# Patient Record
Sex: Male | Born: 1980 | Race: White | Hispanic: No | Marital: Married | Smoking: Current every day smoker
Health system: Southern US, Community
[De-identification: ages and names within clinical notes are randomized; demographics above are authoritative.]

## PROBLEM LIST (undated history)

## (undated) HISTORY — PX: NO PAST SURGERIES: SHX2092

---

## 2004-09-12 ENCOUNTER — Emergency Department: Payer: Self-pay | Admitting: Emergency Medicine

## 2014-05-22 ENCOUNTER — Emergency Department: Payer: Self-pay | Admitting: Emergency Medicine

## 2014-05-22 LAB — CBC WITH DIFFERENTIAL/PLATELET
Basophil #: 0 x10 3/mm 3 (ref 0.0–0.1)
Basophil %: 0.2 %
Eosinophil #: 0 x10 3/mm 3 (ref 0.0–0.7)
Eosinophil %: 0.1 %
HCT: 49.7 % (ref 40.0–52.0)
HGB: 16.8 g/dL (ref 13.0–18.0)
Lymphocyte %: 12.1 %
Lymphs Abs: 1.8 x10 3/mm 3 (ref 1.0–3.6)
MCH: 33.6 pg (ref 26.0–34.0)
MCHC: 33.8 g/dL (ref 32.0–36.0)
MCV: 99 fL (ref 80–100)
Monocyte #: 1.1 x10 3/mm — ABNORMAL HIGH (ref 0.2–1.0)
Monocyte %: 7.2 %
Neutrophil #: 12.1 x10 3/mm 3 — ABNORMAL HIGH (ref 1.4–6.5)
Neutrophil %: 80.4 %
Platelet: 296 x10 3/mm 3 (ref 150–440)
RBC: 5.01 x10 6/mm 3 (ref 4.40–5.90)
RDW: 12.2 % (ref 11.5–14.5)
WBC: 15 x10 3/mm 3 — ABNORMAL HIGH (ref 3.8–10.6)

## 2014-05-22 LAB — COMPREHENSIVE METABOLIC PANEL WITH GFR
Albumin: 4.2 g/dL (ref 3.4–5.0)
Alkaline Phosphatase: 67 U/L
Anion Gap: 13 (ref 7–16)
BUN: 22 mg/dL — ABNORMAL HIGH (ref 7–18)
Bilirubin,Total: 1.3 mg/dL — ABNORMAL HIGH (ref 0.2–1.0)
Calcium, Total: 9.2 mg/dL (ref 8.5–10.1)
Chloride: 99 mmol/L (ref 98–107)
Co2: 26 mmol/L (ref 21–32)
Creatinine: 1.59 mg/dL — ABNORMAL HIGH (ref 0.60–1.30)
EGFR (African American): >60
EGFR (Non-African Amer.): 54 — ABNORMAL LOW
Glucose: 129 mg/dL — ABNORMAL HIGH (ref 65–99)
Osmolality: 281 (ref 275–301)
Potassium: 3.1 mmol/L — ABNORMAL LOW (ref 3.5–5.1)
SGOT(AST): 83 U/L — ABNORMAL HIGH (ref 15–37)
SGPT (ALT): 85 U/L — ABNORMAL HIGH
Sodium: 138 mmol/L (ref 136–145)
Total Protein: 7.6 g/dL (ref 6.4–8.2)

## 2014-05-22 LAB — LIPASE, BLOOD: LIPASE: 118 U/L (ref 73–393)

## 2014-05-23 LAB — COMPREHENSIVE METABOLIC PANEL
ALK PHOS: 66 U/L
ANION GAP: 14 (ref 7–16)
Albumin: 4.2 g/dL (ref 3.4–5.0)
BUN: 15 mg/dL (ref 7–18)
Bilirubin,Total: 1.6 mg/dL — ABNORMAL HIGH (ref 0.2–1.0)
CHLORIDE: 104 mmol/L (ref 98–107)
Calcium, Total: 9 mg/dL (ref 8.5–10.1)
Co2: 20 mmol/L — ABNORMAL LOW (ref 21–32)
Creatinine: 1.4 mg/dL — ABNORMAL HIGH (ref 0.60–1.30)
GLUCOSE: 104 mg/dL — AB (ref 65–99)
Osmolality: 277 (ref 275–301)
Potassium: 3.5 mmol/L (ref 3.5–5.1)
SGOT(AST): 52 U/L — ABNORMAL HIGH (ref 15–37)
SGPT (ALT): 71 U/L — ABNORMAL HIGH
SODIUM: 138 mmol/L (ref 136–145)
Total Protein: 7.6 g/dL (ref 6.4–8.2)

## 2014-05-23 LAB — CBC
HCT: 48 % (ref 40.0–52.0)
HGB: 16.2 g/dL (ref 13.0–18.0)
MCH: 33.3 pg (ref 26.0–34.0)
MCHC: 33.8 g/dL (ref 32.0–36.0)
MCV: 99 fL (ref 80–100)
Platelet: 270 10*3/uL (ref 150–440)
RBC: 4.87 10*6/uL (ref 4.40–5.90)
RDW: 12.3 % (ref 11.5–14.5)
WBC: 10.7 10*3/uL — ABNORMAL HIGH (ref 3.8–10.6)

## 2014-05-23 LAB — URINALYSIS, COMPLETE
BILIRUBIN, UR: NEGATIVE
Blood: NEGATIVE
Glucose,UR: NEGATIVE mg/dL (ref 0–75)
Leukocyte Esterase: NEGATIVE
NITRITE: NEGATIVE
PH: 7 (ref 4.5–8.0)
PROTEIN: NEGATIVE
RBC,UR: 1 /HPF (ref 0–5)
SPECIFIC GRAVITY: 1.02 (ref 1.003–1.030)
Squamous Epithelial: NONE SEEN

## 2014-05-23 LAB — LIPASE, BLOOD: Lipase: 111 U/L (ref 73–393)

## 2014-05-23 LAB — MAGNESIUM: Magnesium: 1.9 mg/dL

## 2014-05-24 ENCOUNTER — Observation Stay: Payer: Self-pay | Admitting: Internal Medicine

## 2014-05-24 LAB — CBC WITH DIFFERENTIAL/PLATELET
Basophil #: 0 10*3/uL (ref 0.0–0.1)
Basophil %: 0.2 %
Eosinophil #: 0 10*3/uL (ref 0.0–0.7)
Eosinophil %: 0.1 %
HCT: 42.5 % (ref 40.0–52.0)
HGB: 14.1 g/dL (ref 13.0–18.0)
LYMPHS PCT: 26.1 %
Lymphocyte #: 2.9 10*3/uL (ref 1.0–3.6)
MCH: 33.3 pg (ref 26.0–34.0)
MCHC: 33.3 g/dL (ref 32.0–36.0)
MCV: 100 fL (ref 80–100)
MONO ABS: 1 x10 3/mm (ref 0.2–1.0)
Monocyte %: 9.4 %
NEUTROS PCT: 64.2 %
Neutrophil #: 7.1 10*3/uL — ABNORMAL HIGH (ref 1.4–6.5)
Platelet: 225 10*3/uL (ref 150–440)
RBC: 4.24 10*6/uL — AB (ref 4.40–5.90)
RDW: 12.2 % (ref 11.5–14.5)
WBC: 11.1 10*3/uL — ABNORMAL HIGH (ref 3.8–10.6)

## 2014-05-24 LAB — COMPREHENSIVE METABOLIC PANEL
ALK PHOS: 54 U/L
AST: 35 U/L (ref 15–37)
Albumin: 3.3 g/dL — ABNORMAL LOW (ref 3.4–5.0)
Anion Gap: 5 — ABNORMAL LOW (ref 7–16)
BILIRUBIN TOTAL: 1.1 mg/dL — AB (ref 0.2–1.0)
BUN: 11 mg/dL (ref 7–18)
CREATININE: 1.29 mg/dL (ref 0.60–1.30)
Calcium, Total: 8.2 mg/dL — ABNORMAL LOW (ref 8.5–10.1)
Chloride: 109 mmol/L — ABNORMAL HIGH (ref 98–107)
Co2: 28 mmol/L (ref 21–32)
EGFR (African American): >60
Glucose: 101 mg/dL — ABNORMAL HIGH (ref 65–99)
OSMOLALITY: 283 (ref 275–301)
Potassium: 4.1 mmol/L (ref 3.5–5.1)
SGPT (ALT): 53 U/L
Sodium: 142 mmol/L (ref 136–145)
Total Protein: 6.3 g/dL — ABNORMAL LOW (ref 6.4–8.2)

## 2014-07-18 ENCOUNTER — Emergency Department: Payer: Self-pay | Admitting: Internal Medicine

## 2014-07-18 LAB — CBC WITH DIFFERENTIAL/PLATELET
BASOS ABS: 0 10*3/uL (ref 0.0–0.1)
BASOS PCT: 0.4 %
BASOS PCT: 0.4 %
Basophil #: 0.1 10*3/uL (ref 0.0–0.1)
EOS ABS: 0.1 10*3/uL (ref 0.0–0.7)
Eosinophil #: 0.1 10*3/uL (ref 0.0–0.7)
Eosinophil %: 0.6 %
Eosinophil %: 0.7 %
HCT: 50.1 % (ref 40.0–52.0)
HCT: 43.1 % (ref 40.0–52.0)
HGB: 16.9 g/dL (ref 13.0–18.0)
HGB: 14.5 g/dL (ref 13.0–18.0)
LYMPHS PCT: 14 %
Lymphocyte #: 1.7 10*3/uL (ref 1.0–3.6)
Lymphocyte #: 1.8 10*3/uL (ref 1.0–3.6)
Lymphocyte %: 10.4 %
MCH: 33.2 pg (ref 26.0–34.0)
MCH: 33.1 pg (ref 26.0–34.0)
MCHC: 33.8 g/dL (ref 32.0–36.0)
MCHC: 33.6 g/dL (ref 32.0–36.0)
MCV: 98 fL (ref 80–100)
MCV: 99 fL (ref 80–100)
MONO ABS: 0.7 x10 3/mm (ref 0.2–1.0)
MONOS PCT: 4.6 %
Monocyte #: 1.2 x10 3/mm — ABNORMAL HIGH (ref 0.2–1.0)
Monocyte %: 9 %
NEUTROS PCT: 84 %
NEUTROS PCT: 75.9 %
Neutrophil #: 13.5 10*3/uL — ABNORMAL HIGH (ref 1.4–6.5)
Neutrophil #: 9.8 10*3/uL — ABNORMAL HIGH (ref 1.4–6.5)
PLATELETS: 274 10*3/uL (ref 150–440)
Platelet: 367 10*3/uL (ref 150–440)
RBC: 5.1 10*6/uL (ref 4.40–5.90)
RBC: 4.37 10*6/uL — ABNORMAL LOW (ref 4.40–5.90)
RDW: 12.7 % (ref 11.5–14.5)
RDW: 12.9 % (ref 11.5–14.5)
WBC: 16 10*3/uL — ABNORMAL HIGH (ref 3.8–10.6)
WBC: 12.9 10*3/uL — ABNORMAL HIGH (ref 3.8–10.6)

## 2014-07-18 LAB — URINALYSIS, COMPLETE
BACTERIA: NONE SEEN
BILIRUBIN, UR: NEGATIVE
Blood: NEGATIVE
GLUCOSE, UR: NEGATIVE mg/dL (ref 0–75)
LEUKOCYTE ESTERASE: NEGATIVE
Nitrite: NEGATIVE
Ph: 9 (ref 4.5–8.0)
Protein: 30
RBC, UR: NONE SEEN /HPF (ref 0–5)
SQUAMOUS EPITHELIAL: NONE SEEN
Specific Gravity: 1.019 (ref 1.003–1.030)
WBC UR: <1 /HPF (ref 0–5)

## 2014-07-18 LAB — COMPREHENSIVE METABOLIC PANEL
Albumin: 4.6 g/dL (ref 3.4–5.0)
Alkaline Phosphatase: 91 U/L (ref 46–116)
Anion Gap: 10 (ref 7–16)
BILIRUBIN TOTAL: 0.8 mg/dL (ref 0.2–1.0)
BUN: 18 mg/dL (ref 7–18)
CALCIUM: 10.2 mg/dL — AB (ref 8.5–10.1)
CHLORIDE: 105 mmol/L (ref 98–107)
Co2: 25 mmol/L (ref 21–32)
Creatinine: 1.53 mg/dL — ABNORMAL HIGH (ref 0.60–1.30)
EGFR (African American): >60
EGFR (Non-African Amer.): 57 — ABNORMAL LOW
Glucose: 149 mg/dL — ABNORMAL HIGH (ref 65–99)
OSMOLALITY: 284 (ref 275–301)
Potassium: 4.4 mmol/L (ref 3.5–5.1)
SGOT(AST): 30 U/L (ref 15–37)
SGPT (ALT): 24 U/L (ref 14–63)
Sodium: 140 mmol/L (ref 136–145)
Total Protein: 8.6 g/dL — ABNORMAL HIGH (ref 6.4–8.2)

## 2014-07-18 LAB — BASIC METABOLIC PANEL
Anion Gap: 7 (ref 7–16)
BUN: 16 mg/dL (ref 7–18)
CALCIUM: 8.9 mg/dL (ref 8.5–10.1)
CHLORIDE: 111 mmol/L — AB (ref 98–107)
CO2: 24 mmol/L (ref 21–32)
Creatinine: 1.21 mg/dL (ref 0.60–1.30)
EGFR (African American): >60
EGFR (Non-African Amer.): >60
Glucose: 98 mg/dL (ref 65–99)
Osmolality: 284 (ref 275–301)
POTASSIUM: 3.9 mmol/L (ref 3.5–5.1)
SODIUM: 142 mmol/L (ref 136–145)

## 2014-07-18 LAB — LIPASE, BLOOD: Lipase: 167 U/L (ref 73–393)

## 2014-07-19 DIAGNOSIS — N179 Acute kidney failure, unspecified: Secondary | ICD-10-CM | POA: Insufficient documentation

## 2014-07-19 DIAGNOSIS — R112 Nausea with vomiting, unspecified: Secondary | ICD-10-CM | POA: Insufficient documentation

## 2014-07-19 DIAGNOSIS — E860 Dehydration: Secondary | ICD-10-CM | POA: Insufficient documentation

## 2014-10-04 NOTE — Consult Note (Signed)
PATIENT NAME:  Stephen Ochoa, Stephen Ochoa MR#:  [id number] DATE OF BIRTH:  1980-09-02  DATE OF CONSULTATION:  05/24/2014    CONSULTING PHYSICIAN:  Midge Minium, MD CONSULTING SERVICE: Gastroenterology    REASON FOR CONSULTATION: Nausea and vomiting.   HISTORY OF PRESENT ILLNESS: The patient is a 34 year old gentleman who states that he went out with his father-in-law for a baseball game, had a bunch of beers, and then started having intractable nausea and vomiting.  The patient states this has been going on since last Sunday. The patient now reports that he was admitted because of nonresolution of his symptoms after being seen at the local urgent care.  The patient was given a diet, which included a BRAT diet that he reports made his symptoms worse.  He was also told to take ginger ale for his heartburn and his abdominal pain which he reports made his vomiting worse.  The patient also followed up with urgent care again and was given much of the same except the second time he was given a shot that helped with his nausea.  He reports that his best day was when he was just taking clear liquids and Pedialyte throughout the day without any problems. The patient then started to advance his diet and he started to have his problem again.  The patient underwent a CT scan of the abdomen and pelvis that was unremarkable for any acute processes. The patient had elevated liver function tests, which was significant for an elevated bilirubin. The patient has been treated with pain medication, Phenergan and he reports taking Benadryl.  The patient states that he thinks some of these medicines are making him worse.  The patient denies any sick contacts or ever having this in the past.  He also denies any marijuana use.     PAST MEDICAL HISTORY: Negative.   PAST SURGICAL HISTORY: None.   HOME MEDICATIONS: None.   ALLERGIES: No known drug allergies.   FAMILY HISTORY: Noncontributory.   SOCIAL HISTORY: History of smoking in the  past but quit a couple of years ago, drinks 1 to 2 beers every weekend.  Denies any drug abuse.   REVIEW OF SYSTEMS:  A 10 point review of systems negative except what was stated above.   PHYSICAL EXAMINATION:  GENERAL: The patient is sitting in bed in no apparent distress is slightly lethargic after having medication a short time ago.  VITAL SIGNS: Temperature 98.3, pulse 67, respirations 17, blood pressure 105/62, pulse oximetry 99%.  HEENT: Normocephalic, atraumatic. Extraocular motor intact. Pupils equally round and reactive to light and accommodation without JVD, without lymphadenopathy. LUNGS: Clear to auscultation. HEART: Regular rate and rhythm without murmurs, rubs, or gallops.  ABDOMEN: Soft, nontender, nondistended, without hepatosplenomegaly.  EXTREMITIES: Without cyanosis, clubbing, or edema.  NEUROLOGICAL: Grossly intact.  SKIN: Without any rashes or lesions.   ANCILLARY SERVICES: CT scan as stated above. Ultrasound showed the patient to have no evidence of cholelithiasis or cholecystitis with multiple lesions consistent with cavernous hemangiomas.   The labs showed the patient to have normal liver enzymes with a total bilirubin of 1.1, white cell count of 11.1.   ASSESSMENT AND PLAN: This patient is a 34 year old gentleman who has intractable nausea and vomiting with an ultrasound and CT scan not showing any source for his symptoms. The patient denies having diabetes. It is unlikely that the patient has gastroparesis because he vomits shortly after eating and it is usually not food he had eaten hours before. The patient  also states that he has significant heartburn which burns more when he vomits. The patient will be put on Protonix p.o. twice a day. The patient was offered IV but states that he feels that he can keep down and swallow pills.  He also has been told that it may take some time for this to help. Despite the numerous other possible causes of nausea and vomiting, such as  central causes, the patient has been told that if this is from a GI cause, it is usually because of nonfunctioning gallbladder, gastritis versus reflux.  If gastritis or reflux was seen on upper endoscopy, he would be treated with a proton pump inhibitor.  Because of this, we will treat him empirically with a proton pump inhibitor and if he does not improve, then an upper endoscopy may be needed.  He also may need a gallbladder emptying study if the symptoms do not improve.   Thank you very much for involving me in the care of this patient. If you have any questions, please do not hesitate to call.  I will follow the patient along with you.     ____________________________ Midge Miniumarren Karder Goodin, MD dw:DT D: 05/24/2014 12:38:09 ET T: 05/24/2014 13:15:23 ET JOB#: [id number]  cc: Midge Miniumarren Grayton Lobo, MD, <Dictator> Midge MiniumARREN Allea Kassner MD ELECTRONICALLY SIGNED 06/02/2014 14:17

## 2014-10-04 NOTE — H&P (Signed)
PATIENT NAME:  Stephen Ochoa, Stephen Ochoa MR#:  [id number] DATE OF BIRTH:  03-07-1981  DATE OF ADMISSION:  05/23/2014  REFERRING DOCTOR: Bobetta Lime A. Inocencio Homes, MD  PRIMARY CARE DOCTOR: None.  ADMITTING DOCTOR: Crissie Figures, MD   CHIEF COMPLAINT: Recurrent/persistent nausea, vomiting with associated crampy abdominal pain ongoing for the past 4 days.    HISTORY OF PRESENT ILLNESS: A 34 year old Caucasian male with no significant past medical history presents to the Emergency Room with the complaints of persistent/recurrent intractable nausea, vomiting associated with crampy abdominal pain ongoing for the past 4 days. The patient states he was doing absolutely well up until last Sunday when he started developing nausea, vomiting with associated crampy abdominal pain for which he went to a local urgent care center was evaluated and treated with IV fluids and was sent home on antinausea medications and pain medications following which his symptoms where under control but continued to have persistent/recurrent nausea, vomiting and abdominal pain; hence went to the urgent care, again, the following day and was treated again with IV fluids and sent home on oral and suppository antiemetic medications and pain medications. The patient continued to have these symptoms intermittently with short periods of able to tolerate oral clear liquids for which he came to the Emergency Room at [facility] on December 10 and was evaluated by the ED physician at that time and the lab work was unremarkable except for a mild elevation of liver function tests. The patient underwent a CT of the abdomen and the pelvis at that time, which was unremarkable for any acute abdominal process. The patient was treated with IV fluids and IV pain medications and IV antiemetics following which his symptoms were under control and was sent home on Zofran and Phenergan suppositories and the pain medications. He continued to have the symptoms with persistent/recurrent  nausea, vomiting with crampy abdomen pain, hence came to the Emergency Room again today for further evaluation. The patient was evaluated by the ER physician again today and lab work again was unremarkable except for mildly elevated liver function tests which kind of improving compared to previous days' visit and underwent a right upper quadrant abdominal sonogram, which is negative for any acute intra-abdominal process. The patient was given IV Dilaudid , IV hydromorphone, IV Zofran, and IV Phenergan following which his nausea and vomiting is under control at this time significantly, but he is not feeling well to go home. Hence, hospitalist service was consulted for further evaluation and management. In view of his persistent ongoing GI symptoms, the patient is reluctant to go home. Denies any fever. No chest pain. No shortness of breath. No cough. No urinary symptoms. No focal weakness or numbness. No history of any prior symptoms in the past. No history of any gastrointestinal problems in the past.   PAST MEDICAL HISTORY: No significant past medical history.   PAST SURGICAL HISTORY: Nil significant.   HOME MEDICATIONS: None.   ALLERGIES: No known drug allergies.   FAMILY HISTORY: Nil significant.   SOCIAL HISTORY: He is married. He is self-employed, works as an Art gallery manager. He lives with his family. History of smoking in the past but quit a few years ago. He does take 1-2 beers every weekend. Denies any substance abuse.   REVIEW OF SYSTEMS:  CONSTITUTIONAL: Negative for fever, fatigue, or generalized weakness. He does have some abdominal pain as mentioned in the history of present illness.  EYES: Negative for blurred vision, double vision. No pain. No redness or inflammation.  EARS,  NOSE, AND THROAT: Negative for tinnitus, ear pain, hearing loss, epistaxis, nasal discharge, difficulty swallowing.  RESPIRATORY: Negative for cough, wheezing, hemoptysis, painful respirations, dyspnea.   CARDIOVASCULAR: Negative for chest pain, orthopnea, pedal edema, dyspnea on exertion. No palpitations. No syncope. No dizziness.  GASTROINTESTINAL: Positive for persistent intractable nausea, vomiting with crappy abdominal pain ongoing for the past 4 days, as mentioned in the history of present illness. No blood in the vomitus. No diarrhea. No melena or hematochezia.  GENITOURINARY: Negative for dysuria, hematuria, frequency, urgency.  ENDOCRINE: Negative for polyuria, nocturia, no heat or cold intolerance.  HEMATOLOGIC AND LYMPHATIC: Negative for anemia, easy bruising or bleeding, or swollen glands.  INTEGUMENTARY: Negative for acne, skin rash, lesions.  MUSCULOSKELETAL: Negative for any arthritis, joint swelling, or pain.  NEUROLOGICAL: Negative for focal weakness or numbness. No history of CVA, TIA, or seizure disorder.  PSYCHIATRIC: Negative for anxiety, insomnia, depression.   PHYSICAL EXAMINATION:  VITAL SIGNS: Temperature 98.2 degrees Fahrenheit, pulse rate 83 per minute, respirations 20 per minute, blood pressure 144/97, oxygen saturation 100% on room air.  GENERAL: A well-developed, well-nourished young male alert and oriented. Not in any acute distress at this time.   HEAD: Atraumatic, normocephalic.  EYES: Pupils are equal and reactive to light and accommodation. No conjunctival pallor. No scleral icterus. Extraocular movements intact.  NOSE: No nasal lesions. No drainage.  EARS: No drainage. No external lesions.  ORAL CAVITY: No mucosal lesions. No exudates. No masses.  NECK: Supple. No JVD. No thyromegaly. No carotid bruit. Range of motion of the neck is normal.  RESPIRATORY: Good respiratory effort. Clear to auscultation bilaterally. No rales or rhonchi.  CARDIOVASCULAR: S1, S2 regular. No murmurs appreciated. Peripheral pulses equal at carotid, femoral, and pedal pulses. No peripheral edema.  GASTROINTESTINAL: Abdomen is soft. Diffuse epigastric mild tenderness. No guarding. No  rigidity. No rebound. No hepatosplenomegaly. Bowel sounds present and equal in all 4 quadrants.  GENITOURINARY: Deferred.  MUSCULOSKELETAL: Range of motion adequate in all joints. Strength and tone are equal bilaterally.  SKIN: Inspection within normal limits.  LYMPHATIC: No cervical lymphadenopathy.  VASCULAR: Good dorsalis pedis and posterior tibial pulses.  NEUROLOGICAL: Alert, awake, and oriented x 3. Cranial nerves II-XII grossly intact.  DTRs 2+ and symmetrical in both upper and lower extremities. Motor strength is 5/5 in both upper and lower extremities.  PSYCHIATRIC: Judgment and insight are adequate. Alert and oriented x 3. Memory and mood are intact within normal limits.   LABORATORY DATA: Serum glucose 104, BUN 15, creatinine 1.40, sodium 138, potassium 3.5, chloride 104, bicarbonate 20. Total calcium 9.0, magnesium 1.9, lipase 111, total protein 7.6, serum albumin 4.2, total bilirubin 1.6, alkaline phosphatase 66, AST 52, ALT 71 CBC: WBC 10.7, hemoglobin 16.2, hematocrit 48.0, platelet count 270,000. Urinalysis clear, 2+ ketones, negative nitrite, negative leukocyte esterase, WBC is 3+ per high-power field, bacteria, trace.   IMAGING STUDIES:  1.  CT of the abdomen and pelvis with contrast done on 05/22/2014: No focal acute inflammation process demonstrated. Liver lesions most likely representing hemangioma.  2.  Ultrasound of abdomen done today reveals no evidence of cholelithiasis or cholecystitis. Multiple hypoechoic liver lesions consistent with cavernous hemangioma and corresponding to changes on previous CT   3.  EKG: Sinus tachycardia with ventricular rate of 103 beats per minute. No acute ST-T changes.   ASSESSMENT AND PLAN: A 34 year old Caucasian male with no significant past medical history presents with the complaints of ongoing intractable persistent nausea, vomiting with crampy abdominal pain going on  for the past 4 days.   1.  Persistent intractable nausea, vomiting with  crampy abdominal pain ongoing for the past 4 days. Two urgent care visits recently and 2 Emergency Room visits. Workup in the Emergency Room with a negative CT of the abdomen and the pelvis and the negative right upper quadrant abdominal sonogram for any acute intra-abdominal process. Differential diagnoses include possible viral illnesses, however, rule out any peptic ulcer disease versus gastritis. PLAN: Admit to medical floor. We will keep him n.p.o., intravenous fluids. We will continue intravenous antiemetics like intravenous Zofran and Phenergan as needed, intravenous pain medications hydromorphone, and intravenous Protonix for now. Follow up labs. Continue further workup including a gastrointestinal consult if symptoms persist.  2.  Mildly elevated liver function tests: Examination benign. A CT of the abdomen and right upper quadrant sonogram negative for any cholelithiasis or cholecystitis. Possibly elevated liver functions could be mild viral illnesses. We will check acute hepatitis panel to evaluate for any acute hepatitis. Monitor for now.  3.  Deep vein thrombosis prophylaxis: Subcutaneous Lovenox.   CODE STATUS: Full code.   TIME SPENT: 50 minutes.    ____________________________ Crissie Figures, MD enr:bm D: 05/23/2014 23:48:52 ET T: 05/24/2014 00:30:23 ET JOB#: [id number]  cc: Crissie Figures, MD, <Dictator> Crissie Figures MD ELECTRONICALLY SIGNED 05/24/2014 17:39

## 2014-10-04 NOTE — Consult Note (Signed)
Brief Consult Note: Diagnosis: Patient with nausea and vomiting for the last 7 days. Reports that he has not been able to keep food down. Has chronic reflux.   Patient was seen by consultant.   Comments: The patient will be put on a PPI PO bid.  Bili increased but likely Gilberts. Will check direct bili and indirect.  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 Hydration, anti-emestics and PPI for now. If no improvement then EGD.  Electronic Signatures: Midge MiniumWohl, Edeline Greening (MD)  (Signed 12-Dec-15 11:18)  Authored: Brief Consult Note   Last Updated: 12-Dec-15 11:18 by Midge MiniumWohl, Candelario Steppe (MD)

## 2014-10-04 NOTE — Discharge Summary (Signed)
PATIENT NAME:  Stephen Ochoa, Stephen Ochoa MR#:  [id number] DATE OF BIRTH:  1981/02/07  DATE OF ADMISSION:  05/23/2014 DATE OF DISCHARGE:  05/24/2014  ADMITTING DIAGNOSES: Intractable nausea and vomiting.   DISCHARGE DIAGNOSES:  1.  Intractable nausea and vomiting, likely acute gastritis.  2.  Diffuse abdominal pain.  3.  Elevated transaminases of unclear etiology, resolved.  4.  Acute renal insufficiency due to dehydration, resolved on intravenous fluids.  5.  Leukocytosis.   DISCHARGE CONDITION: Stable.   DISCHARGE MEDICATIONS: The patient is to continue Zofran ODT 4 mg every 4-6 hours as needed, promethazine 25 mg rectal suppository every 4-6 hours as needed, omeprazole 40 mg p.o. twice daily, Ensure Clear 200 mL 4 times daily as needed.   HOME OXYGEN: None.   DIET: Regular except with Ensure Clear 4 times daily. The patient was advised to advance diet as tolerated to regular consistency initially to initiate diet as pureed diet.   ACTIVITY LIMITATIONS: As tolerated.   FOLLOWUP: Appointment with Dr. Servando Snare in 1 week after discharge.   CONSULTANTS: Care management, social work, Dr. Servando Snare of gastroenterology.   RADIOLOGIC STUDIES: Ultrasound of abdomen limited survey on 05/23/2014 showed no evidence of cholelithiasis or cholecystitis. Multiple hyperechoic liver lesions consistent with cavernous hemangiomas and corresponding to changes on previous CT scan were found.   HISTORY OF PRESENT ILLNESS: The patient is a 34 year old Caucasian male with no significant past medical history who presented to the hospital on 05/23/2014 with complaints of recurrent persistent nausea, as well as vomiting, and associated crampy abdominal pain, which was going on for at least 4 days prior to coming to the Emergency Room. Please refer to Dr. Reddy's admission done on 05/23/2014. On arrival to the hospital, the patient's temperature was 98.2, pulse was 83, respiratory rate was 20, blood pressure 144/97, saturation was  100% on room air. Physical exam was unremarkable. The patient's lab data done in the hospital revealed an elevated creatinine of 1.4, glucose level of 104, lipase 111, magnesium level was normal at 1.9, bicarbonate level was low at 20. Liver enzymes showed a total bilirubin of 1.6, AST 52, and ALT was 71. White blood cell count was elevated to 10.7, hemoglobin was 16.2, platelet count was 270,000. Urinalysis was remarkable for 3 white blood cells, 1 red blood cell, trace bacteria, mucus was present, otherwise unremarkable. The patient's EKG done on 05/22/2014 showed normal sinus rhythm, possible left atrial enlargement, borderline EKG.   HOSPITAL COURSE: The patient was admitted to the hospital for further evaluation. Gastroenterology consultation was obtained. Dr. Servando Snare saw the patient in consultation. He felt that patient likely had chronic gastroesophageal reflux disease, as well as possible gastritis. 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 The patient was given IV fluids with which his kidney function normalized as well as his liver enzyme tests. His condition overall improved with antiemetics as well as IV fluids. He was able to eat a full liquid diet prior to leaving the hospital. He was recommended to advance his diet to a regular diet in the next 1 week and follow up with Dr. Servando Snare in the next few days.   In regards to elevated transaminases, the patient's liver enzymes improved on 05/24/2014. The patient's AST as well as ALT were within normal limits on 05/24/2014. The patient's total bilirubin was found to be slightly elevated at 1.1; however, unfortunately, no direct  bilirubin was performed. It  is recommended to follow the patient's liver tests as outpatient and make decisions about investigating for underlying liver disease if needed.   For hyperglycemia, the patient's hemoglobin A1c was intended to be  checked; however, it was not checked prior to leaving from the hospital.   For white blood cell count elevation, it was felt to be related to stress likely. It is recommended to follow the patient's white blood cell count as an outpatient.   The patient felt satisfactory, did not complain of any significant discomfort. On 05/24/2014, he wanted to leave the hospital and requested discharge. His temperature was 98.5, pulse was 71, respirations was 18, blood pressure 115/70, saturation was 98% on room air at rest.   TIME SPENT: 40 minutes.    ____________________________ Stephen Caperima Alpha Chouinard, MD rv:ts D: 05/24/2014 19:06:00 ET T: 05/24/2014 22:23:49 ET JOB#: [id number]  cc: Stephen Miniumarren Wohl, MD Stephen Caperima Aqib Lough, MD, <Dictator>  Stephen Brigandi MD ELECTRONICALLY SIGNED 06/10/2014 21:20

## 2015-11-08 IMAGING — CT CT ABD-PELV W/ CM
2 of 4 series · 17 of 46 positions shown, 19 images · IV contrast (agent unspecified)
Comparison: None.

CLINICAL DATA: Severe abdominal pain, nausea and vomiting started
on [REDACTED]. Patient went to the [HOSPITAL] on [REDACTED] and was given
suppository. Continues to have symptoms. Pressure rollover abdomen.
Shortness of breath and dizziness. White cell count 15

EXAM:
CT ABDOMEN AND PELVIS WITH CONTRAST
TECHNIQUE: Multidetector CT imaging of the abdomen and pelvis was performed
using the standard protocol following bolus administration of
intravenous contrast.
CONTRAST:  100 mL Lsovue-PDD

[Series 2: routine abd pel with · axial · 0.65mm/px · z∈[-501,-96]mm · 14 of 89 slices shown, 16 images]
[im 4/89  soft-tissue]
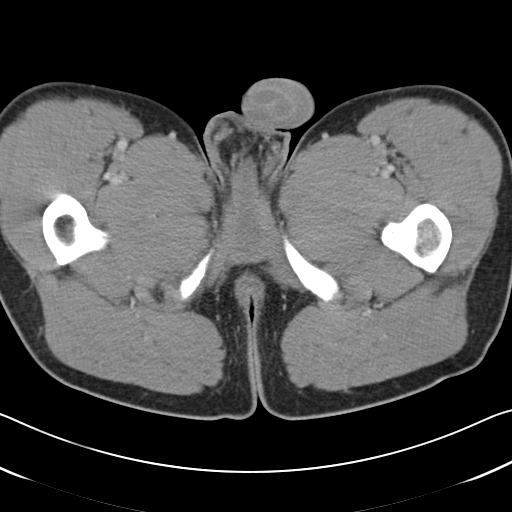
[im 4/89  bone]
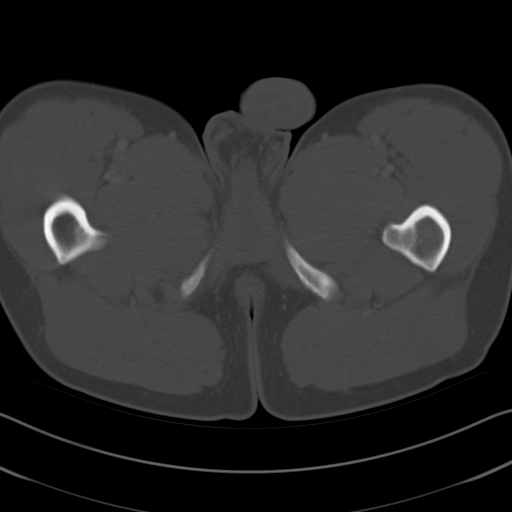
[im 12/89  soft-tissue]
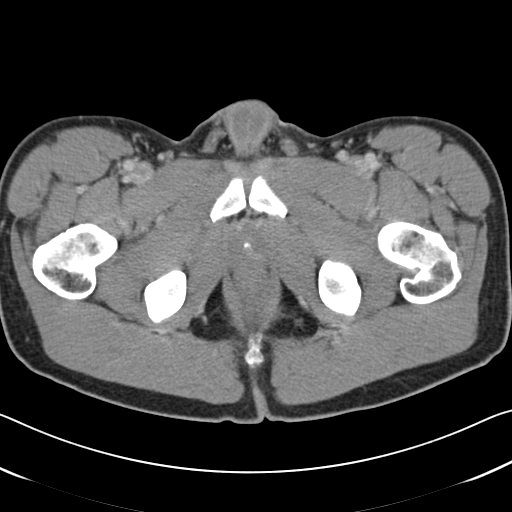
[im 19/89  soft-tissue]
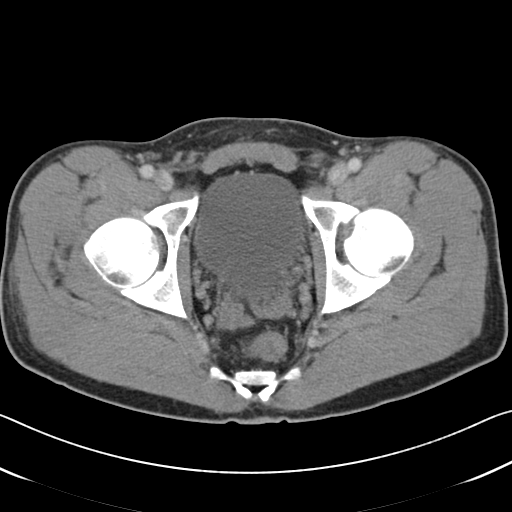
[im 23/89  soft-tissue]
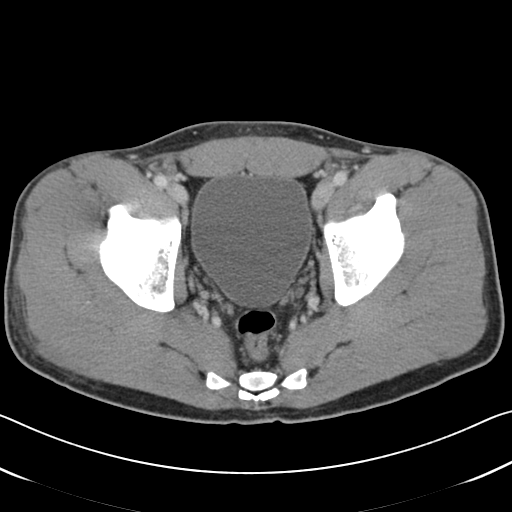
[im 30/89  soft-tissue]
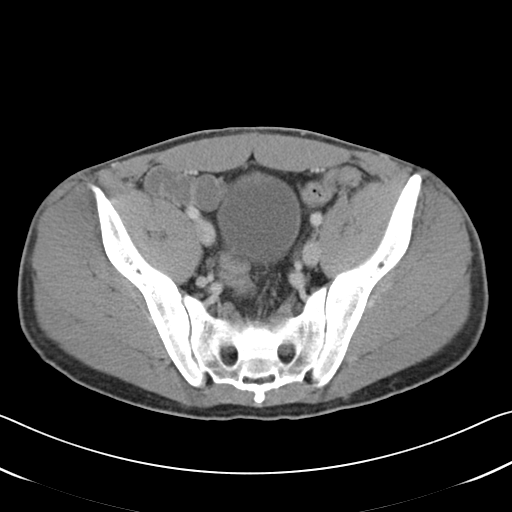
[im 37/89  soft-tissue]
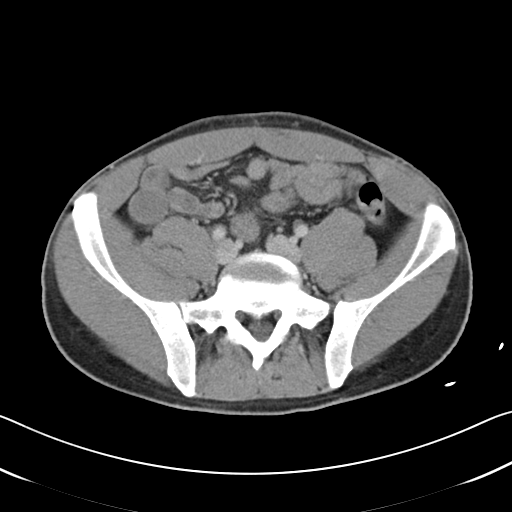
[im 41/89  soft-tissue]
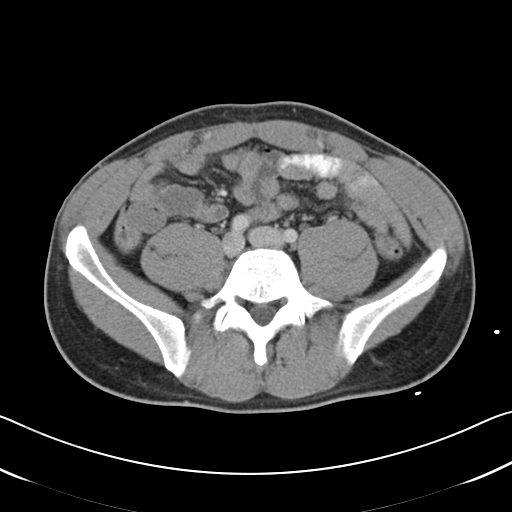
[im 48/89  soft-tissue]
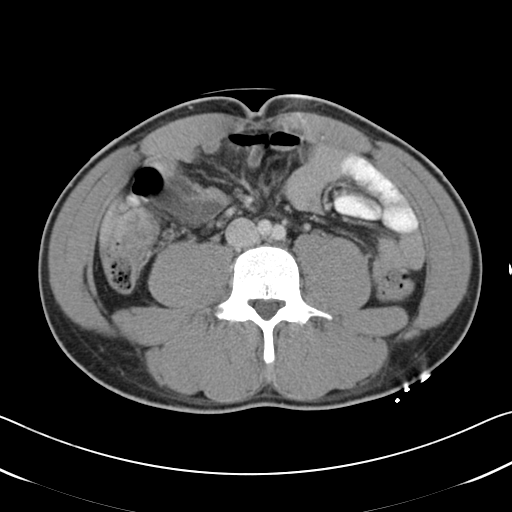
[im 52/89  soft-tissue]
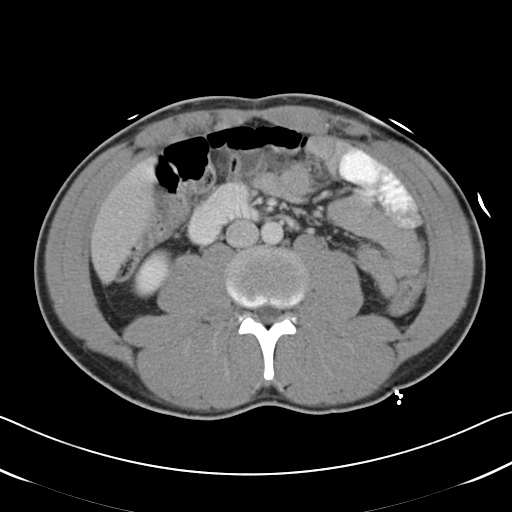
[im 52/89  bone]
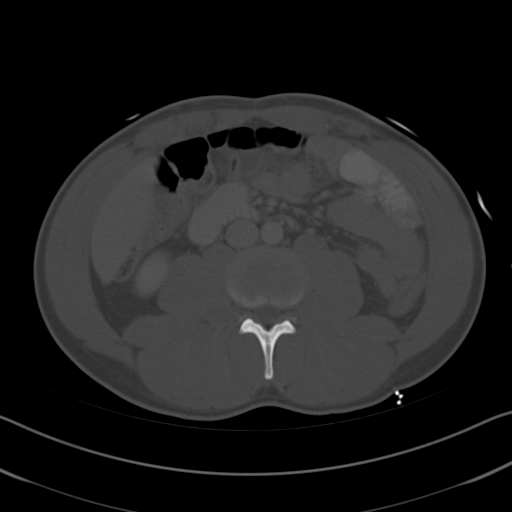
[im 59/89  soft-tissue]
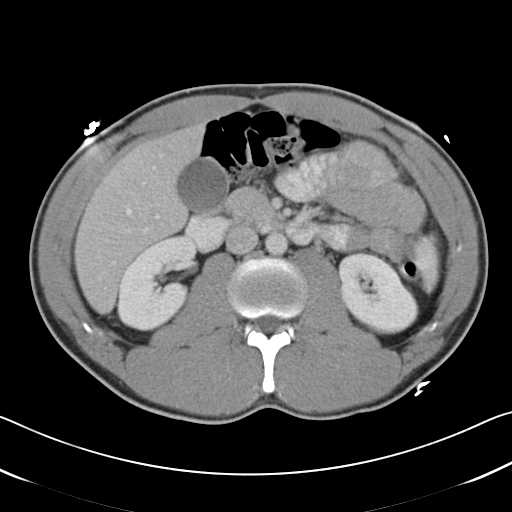
[im 67/89  soft-tissue]
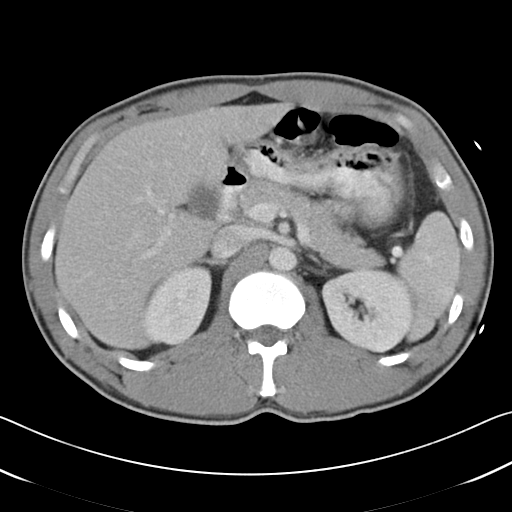
[im 70/89  soft-tissue]
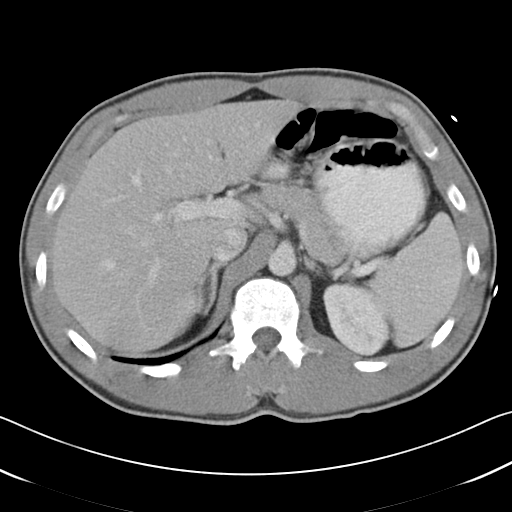
[im 78/89  soft-tissue]
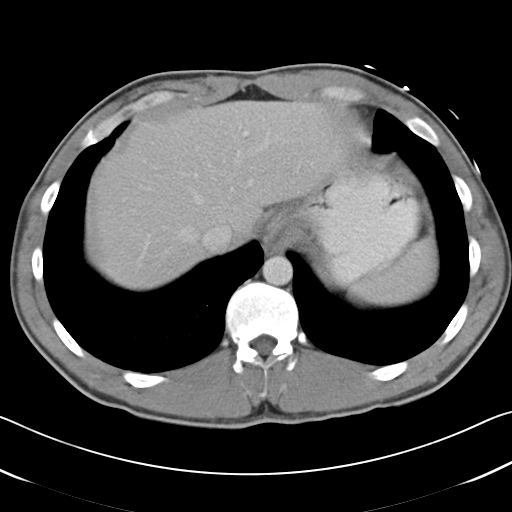
[im 85/89  soft-tissue]
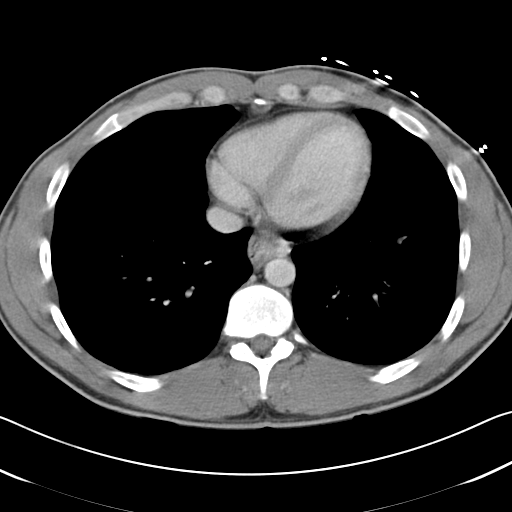

[Series 5: cor routine abd pel with · coronal · 0.89mm/px · 3 of 116 slices shown]
[im 39/116  soft-tissue]
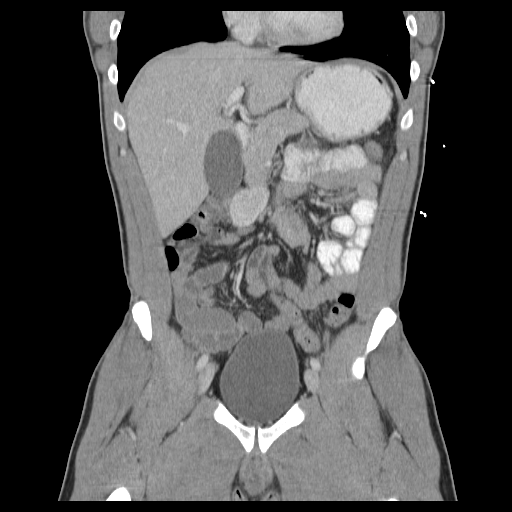
[im 52/116  soft-tissue]
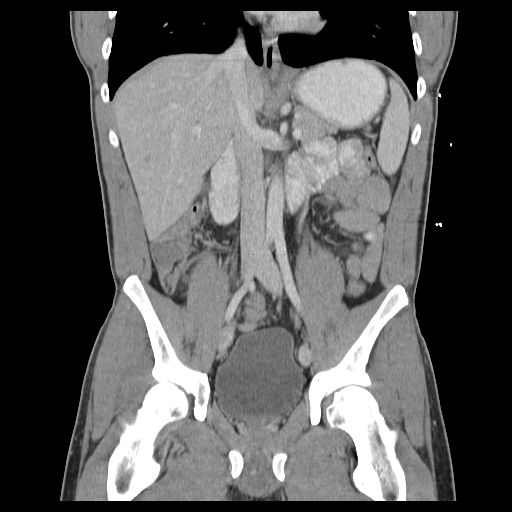
[im 64/116  soft-tissue]
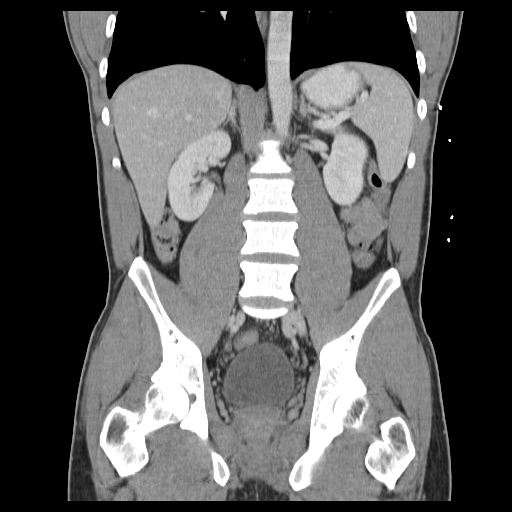

[17 of 46 positions shown; findings below may reference images not displayed]

FINDINGS: Lung bases are clear.  Small esophageal hiatal hernia.

Small liver lesions, largest measuring 1.1 cm, demonstrate vague
peripheral enhancement, likely representing small hemangiomas. The
gallbladder, spleen, pancreas, adrenal glands, kidneys, abdominal
aorta, inferior vena cava, and retroperitoneal lymph nodes are
unremarkable. Stomach appears normal. Small bowel are not abnormally
distended. Colon is stool filled with decompression. No free air or
free fluid in the abdomen.

Pelvis: The appendix is not definitively identified but no
inflammatory changes are suggested in the right lower quadrant.
Prostate gland is not enlarged. Bladder wall is not thickened. No
free or loculated pelvic fluid collections. No pelvic mass or
lymphadenopathy. No evidence of diverticulitis. No destructive bone
lesions.
IMPRESSION: No focal acute inflammatory process demonstrated to account for
patient's symptoms. Liver lesions most likely representing
hemangiomas.

## 2016-11-01 ENCOUNTER — Encounter: Payer: Self-pay | Admitting: Emergency Medicine

## 2016-11-01 ENCOUNTER — Emergency Department
Admission: EM | Admit: 2016-11-01 | Discharge: 2016-11-01 | Disposition: A | Discharge status: Home / Self Care | Payer: 59 | Attending: Emergency Medicine | Admitting: Emergency Medicine

## 2016-11-01 DIAGNOSIS — G43A0 Cyclical vomiting, not intractable: Secondary | ICD-10-CM | POA: Insufficient documentation

## 2016-11-01 DIAGNOSIS — R112 Nausea with vomiting, unspecified: Secondary | ICD-10-CM | POA: Diagnosis present

## 2016-11-01 LAB — COMPREHENSIVE METABOLIC PANEL
ALBUMIN: 4.2 g/dL (ref 3.5–5.0)
ALT: 32 U/L (ref 17–63)
AST: 36 U/L (ref 15–41)
Alkaline Phosphatase: 54 U/L (ref 38–126)
Anion gap: 9 (ref 5–15)
BUN: 13 mg/dL (ref 6–20)
CHLORIDE: 107 mmol/L (ref 101–111)
CO2: 23 mmol/L (ref 22–32)
CREATININE: 1.16 mg/dL (ref 0.61–1.24)
Calcium: 8.9 mg/dL (ref 8.9–10.3)
GFR calc Af Amer: >60 mL/min (ref >60)
GFR calc non Af Amer: >60 mL/min (ref >60)
GLUCOSE: 127 mg/dL — AB (ref 65–99)
Potassium: 3.5 mmol/L (ref 3.5–5.1)
Sodium: 139 mmol/L (ref 135–145)
Total Bilirubin: 1 mg/dL (ref 0.3–1.2)
Total Protein: 6.6 g/dL (ref 6.5–8.1)

## 2016-11-01 LAB — CBC
HCT: 46 % (ref 40.0–52.0)
Hemoglobin: 16.1 g/dL (ref 13.0–18.0)
MCH: 34.2 pg — AB (ref 26.0–34.0)
MCHC: 34.9 g/dL (ref 32.0–36.0)
MCV: 98 fL (ref 80.0–100.0)
PLATELETS: 259 10*3/uL (ref 150–440)
RBC: 4.7 MIL/uL (ref 4.40–5.90)
RDW: 12.7 % (ref 11.5–14.5)
WBC: 11.8 10*3/uL — ABNORMAL HIGH (ref 3.8–10.6)

## 2016-11-01 LAB — BLOOD GAS, VENOUS
Acid-Base Excess: 3.2 mmol/L — ABNORMAL HIGH (ref 0.0–2.0)
Bicarbonate: 25.3 mmol/L (ref 20.0–28.0)
PH VEN: 7.52 — AB (ref 7.250–7.430)
Patient temperature: 37
pCO2, Ven: 31 mmHg — ABNORMAL LOW (ref 44.0–60.0)
pO2, Ven: <31 mmHg — CL (ref 32.0–45.0)

## 2016-11-01 LAB — LIPASE, BLOOD: Lipase: 40 U/L (ref 11–51)

## 2016-11-01 MED ORDER — MORPHINE SULFATE (PF) 4 MG/ML IV SOLN
4.0000 mg | Freq: Once | INTRAVENOUS | Status: AC
  Administered 2016-11-01: 4 mg via INTRAVENOUS
  Filled 2016-11-01: qty 1

## 2016-11-01 MED ORDER — LORAZEPAM 2 MG/ML IJ SOLN
1.0000 mg | Freq: Once | INTRAMUSCULAR | Status: AC
  Administered 2016-11-01: 1 mg via INTRAVENOUS

## 2016-11-01 MED ORDER — ONDANSETRON HCL 4 MG/2ML IJ SOLN
4.0000 mg | Freq: Once | INTRAMUSCULAR | Status: AC
  Administered 2016-11-01: 4 mg via INTRAVENOUS

## 2016-11-01 MED ORDER — LORAZEPAM 2 MG/ML IJ SOLN
1.0000 mg | Freq: Once | INTRAMUSCULAR | Status: AC
  Administered 2016-11-01: 1 mg via INTRAVENOUS
  Filled 2016-11-01: qty 1

## 2016-11-01 MED ORDER — FAMOTIDINE 20 MG PO TABS: 20.0000 mg | ORAL_TABLET | Freq: Two times a day (BID) | ORAL | 1 refills | Status: DC

## 2016-11-01 MED ORDER — ONDANSETRON HCL 4 MG/2ML IJ SOLN
4.0000 mg | Freq: Once | INTRAMUSCULAR | Status: AC | PRN
  Administered 2016-11-01: 4 mg via INTRAVENOUS

## 2016-11-01 MED ORDER — SODIUM CHLORIDE 0.9 % IV SOLN
Freq: Once | INTRAVENOUS | Status: AC
  Administered 2016-11-01: 11:00:00 via INTRAVENOUS

## 2016-11-01 MED ORDER — LORAZEPAM 1 MG PO TABS: 1.0000 mg | ORAL_TABLET | Freq: Three times a day (TID) | ORAL | 0 refills | Status: AC | PRN

## 2016-11-01 MED ORDER — HALOPERIDOL LACTATE 5 MG/ML IJ SOLN
2.0000 mg | Freq: Once | INTRAMUSCULAR | Status: AC
  Administered 2016-11-01: 2 mg via INTRAVENOUS

## 2016-11-01 NOTE — ED Provider Notes (Signed)
[facility] Emergency Department Provider Note       Time seen: ----------------------------------------- 10:41 AM on 11/01/2016 -----------------------------------------     I have reviewed the triage vital signs and the nursing notes.   HISTORY   Chief Complaint Emesis; Nausea; and Diarrhea    HPI Stephen Ochoa is a 36 y.o. male who presents to the ED for nausea, vomiting and diarrhea with severe abdominal cramping since Saturday. Patient was seen at [facility] for same and was given Zofran to take at home with some relief. Patient states she got home and was somewhat better but now the symptoms have returned. He thinks he may have overdone it in terms of his diet after this recent GI upset. He does not chronically take an acids. He denies fevers, chills or other complaints.   History reviewed. No pertinent past medical history.  There are no active problems to display for this patient.   History reviewed. No pertinent surgical history.  Allergies Patient has no known allergies.  Social History Social History  Substance Use Topics  . Smoking status: Never Smoker  . Smokeless tobacco: Never Used  . Alcohol use No    Review of Systems Constitutional: Negative for fever. Eyes: Negative for vision changes ENT:  Negative for congestion, sore throat Cardiovascular: Negative for chest pain. Respiratory: Negative for shortness of breath. Gastrointestinal: Positive for abdominal pain, vomiting and diarrhea Genitourinary: Negative for dysuria. Musculoskeletal: Negative for back pain. Skin: Negative for rash. Neurological: Negative for headaches, focal weakness or numbness.  All systems negative/normal/unremarkable except as stated in the HPI  ____________________________________________   PHYSICAL EXAM:  VITAL SIGNS: ED Triage Vitals  Enc Vitals Group     BP 11/01/16 1011 (!) 142/98     Pulse Rate 11/01/16 1011 68     Resp 11/01/16 1011  (!) 24     Temp 11/01/16 1011 97.9 F (36.6 C)     Temp Source 11/01/16 1011 Oral     SpO2 11/01/16 1011 100 %     Weight 11/01/16 1012 150 lb (68 kg)     Height 11/01/16 1012 5\' 8"  (1.727 m)     Head Circumference --      Peak Flow --      Pain Score 11/01/16 1010 7     Pain Loc --      Pain Edu? --      Excl. in GC? --    Constitutional: Alert and oriented. Hyperventilating, mild to moderate distress Eyes: Conjunctivae are normal. Normal extraocular movements. ENT   Head: Normocephalic and atraumatic.   Nose: No congestion/rhinnorhea.   Mouth/Throat: Mucous membranes are moist.   Neck: No stridor. Cardiovascular: Normal rate, regular rhythm. No murmurs, rubs, or gallops. Respiratory: Tachypnea with clear breath sounds Gastrointestinal: Soft with nonfocal tenderness. Normal bowel sounds Musculoskeletal: Nontender with normal range of motion in extremities. No lower extremity tenderness nor edema. Neurologic:  Normal speech and language. No gross focal neurologic deficits are appreciated.  Skin:  Skin is warm, dry and intact. No rash noted. Psychiatric: Mood and affect are normal. Speech and behavior are normal.  ____________________________________________  ED COURSE:  Pertinent labs & imaging results that were available during my care of the patient were reviewed by me and considered in my medical decision making (see chart for details). Patient presents for abdominal upset with vomiting and diarrhea, we will assess with labs and imaging as indicated.   Procedures ____________________________________________   LABS (pertinent positives/negatives)  Labs Reviewed  CBC - Abnormal; Notable for the following:       Result Value   WBC 11.8 (*)    MCH 34.2 (*)    All other components within normal limits  BLOOD GAS, VENOUS - Abnormal; Notable for the following:    pH, Ven 7.52 (*)    pCO2, Ven 31 (*)    pO2, Ven <31.0 (*)    Acid-Base Excess 3.2 (*)    All  other components within normal limits  COMPREHENSIVE METABOLIC PANEL - Abnormal; Notable for the following:    Glucose, Bld 127 (*)    All other components within normal limits  LIPASE, BLOOD  URINALYSIS, COMPLETE (UACMP) WITH MICROSCOPIC  ____________________________________________  FINAL ASSESSMENT AND PLAN  Cyclic vomiting syndrome  Plan: Patient's labs were dictated above. Patient had presented for Vomiting and abdominal pain with an unremarkable examination. Patient was hyperventilating on arrival and he has symptoms and signs of cyclic vomiting syndrome. This has occurred intermittently for years and seems to have an anxiety component. Currently his symptoms are resolved. He'll be discharged with symptomatic treatment and he is stable for outpatient follow-up.   Emily FilbertWilliams, Majorie Santee E, MD   Note: This note was generated in part or whole with voice recognition software. Voice recognition is usually quite accurate but there are transcription errors that can and very often do occur. I apologize for any typographical errors that were not detected and corrected.     Emily FilbertWilliams, Desmin Daleo E, MD 11/01/16 1215

## 2016-11-01 NOTE — ED Notes (Signed)
Patient given gingerale and saltine crackers.  Will continue to monitor.

## 2016-11-01 NOTE — ED Notes (Signed)
Patient reports he has had vomiting and abdominal pain since this weekend.  Patient reports having similar symptoms in the past and seeing a GI doctor but denies any diagnosis.  [facility] and told he likely has a virus.  Patient appears uncomfortable.

## 2016-11-01 NOTE — ED Triage Notes (Signed)
Pt presents with n/v/d since Saturday, was [facility] and given zofran to take home with some relief. Pt states got some what better but now all sx have returned.

## 2016-11-09 ENCOUNTER — Other Ambulatory Visit: Payer: Self-pay | Admitting: Gastroenterology

## 2016-11-09 DIAGNOSIS — R1084 Generalized abdominal pain: Secondary | ICD-10-CM

## 2016-11-09 DIAGNOSIS — R112 Nausea with vomiting, unspecified: Secondary | ICD-10-CM

## 2016-11-16 ENCOUNTER — Ambulatory Visit
Admission: RE | Admit: 2016-11-16 | Discharge: 2016-11-16 | Disposition: A | Discharge status: Home / Self Care | Payer: 59 | Source: Ambulatory Visit | Attending: Gastroenterology | Admitting: Gastroenterology

## 2016-11-16 DIAGNOSIS — R112 Nausea with vomiting, unspecified: Secondary | ICD-10-CM | POA: Diagnosis present

## 2016-11-16 DIAGNOSIS — R1084 Generalized abdominal pain: Secondary | ICD-10-CM | POA: Diagnosis not present

## 2016-11-29 ENCOUNTER — Ambulatory Visit: Payer: 59

## 2019-08-15 ENCOUNTER — Ambulatory Visit (INDEPENDENT_AMBULATORY_CARE_PROVIDER_SITE_OTHER): Payer: Managed Care, Other (non HMO) | Admitting: Physician Assistant

## 2019-08-15 ENCOUNTER — Encounter: Payer: Self-pay | Admitting: Physician Assistant

## 2019-08-15 ENCOUNTER — Other Ambulatory Visit: Payer: Self-pay

## 2019-08-15 ENCOUNTER — Telehealth: Payer: Self-pay

## 2019-08-15 VITALS — BP 126/70 | HR 66 | Temp 97.3°F | Ht 68.0 in | Wt 163.0 lb

## 2019-08-15 DIAGNOSIS — N6321 Unspecified lump in the left breast, upper outer quadrant: Secondary | ICD-10-CM

## 2019-08-15 DIAGNOSIS — K625 Hemorrhage of anus and rectum: Secondary | ICD-10-CM | POA: Diagnosis not present

## 2019-08-15 DIAGNOSIS — B079 Viral wart, unspecified: Secondary | ICD-10-CM

## 2019-08-15 DIAGNOSIS — R61 Generalized hyperhidrosis: Secondary | ICD-10-CM

## 2019-08-15 DIAGNOSIS — B078 Other viral warts: Secondary | ICD-10-CM | POA: Diagnosis not present

## 2019-08-15 DIAGNOSIS — Q383 Other congenital malformations of tongue: Secondary | ICD-10-CM | POA: Diagnosis not present

## 2019-08-15 MED ORDER — PODOFILOX 0.5 % EX GEL: Freq: Two times a day (BID) | CUTANEOUS | 0 refills | Status: AC

## 2019-08-15 NOTE — Patient Instructions (Signed)

## 2019-08-15 NOTE — Telephone Encounter (Signed)
Copied from CRM [id number]. Topic: General - Other >> Aug 15, 2019  9:32 AM Maye Hides wrote: Reason for CRM: [facility] states order entered is incorrect. They will need an order for for [id number] and [id number]. Thanks

## 2019-08-15 NOTE — Telephone Encounter (Signed)
Orders changed. 

## 2019-08-15 NOTE — Progress Notes (Signed)
Patient: Stephen Ochoa, Male    DOB: 10-Apr-1981, 39 y.o.   MRN: [id number] Visit Date: 08/15/2019  Today's Provider: Margaretann Loveless, PA-C   Chief Complaint  Patient presents with  . Establish Care   Subjective:     Establish Care Stephen Ochoa is a 39 y.o. male who presents today to establish care. He feels fairly well. He reports exercising. He reports he is sleeping poorly.  He is a self-employed Personnel officer. He is married with one child.  -----------------------------------------------------------------  Bloody Stool: been ongoing for a few years. Started work up in 2018, but eventually stopped going to appointments. Still has on occasion. Can be just on TP and sometimes mixed in stool. No abdominal cramping or abnormal BM.  Lump under left nipple, noticed about a month ago. Not tender today but can be. Moves. No nipple discharge. No skin changes. No known breast cancer family history.  "Bumps" on tongue Has warts on hands and as some on the pubic region and penis. Has been using Condylox and that is helping lessen    Review of Systems  Constitutional: Positive for diaphoresis (At night.). Negative for activity change, appetite change, chills, fatigue, fever and unexpected weight change.  HENT: Positive for mouth sores and nosebleeds. Negative for congestion, dental problem, drooling, ear discharge, ear pain, facial swelling, hearing loss, postnasal drip, rhinorrhea, sinus pressure, sinus pain, sneezing, sore throat, tinnitus, trouble swallowing and voice change.   Eyes: Positive for redness. Negative for photophobia, pain, discharge, itching and visual disturbance.  Respiratory: Negative.   Cardiovascular: Negative.   Gastrointestinal: Positive for anal bleeding and blood in stool. Negative for abdominal distention, abdominal pain, constipation, diarrhea, nausea, rectal pain and vomiting.  Endocrine: Negative.   Genitourinary: Positive for genital sores. Negative  for decreased urine volume, difficulty urinating, discharge, dysuria, enuresis, flank pain, frequency, hematuria, penile pain, penile swelling, scrotal swelling, testicular pain and urgency.  Musculoskeletal: Negative.   Skin: Negative.   Allergic/Immunologic: Negative.   Neurological: Negative.   Hematological: Negative.   Psychiatric/Behavioral: Negative.     Social History      He  reports that he has been smoking. He has never used smokeless tobacco. He reports that he does not drink alcohol or use drugs.       Social History   Socioeconomic History  . Marital status: Married    Spouse name: Not on file  . Number of children: Not on file  . Years of education: Not on file  . Highest education level: Not on file  Occupational History  . Not on file  Tobacco Use  . Smoking status: Current Every Day Smoker  . Smokeless tobacco: Never Used  Substance and Sexual Activity  . Alcohol use: No  . Drug use: No  . Sexual activity: Not on file  Other Topics Concern  . Not on file  Social History Narrative  . Not on file   Social Determinants of Health   Financial Resource Strain:   . Difficulty of Paying Living Expenses: Not on file  Food Insecurity:   . Worried About Programme researcher, broadcasting/film/video in the Last Year: Not on file  . Ran Out of Food in the Last Year: Not on file  Transportation Needs:   . Lack of Transportation (Medical): Not on file  . Lack of Transportation (Non-Medical): Not on file  Physical Activity:   . Days of Exercise per Week: Not on file  . Minutes of  Exercise per Session: Not on file  Stress:   . Feeling of Stress : Not on file  Social Connections:   . Frequency of Communication with Friends and Family: Not on file  . Frequency of Social Gatherings with Friends and Family: Not on file  . Attends Religious Services: Not on file  . Active Member of Clubs or Organizations: Not on file  . Attends Archivist Meetings: Not on file  . Marital Status:  Not on file    No past medical history on file.   There are no problems to display for this patient.   Past Surgical History:  Procedure Laterality Date  . NO PAST SURGERIES      Family History        Family Status  Relation Name Status  . Mother  Alive  . Father  Alive  . MGM  Alive  . MGF  Alive  . PGM  Alive  . PGF  Alive        His family history includes Cancer in his paternal grandfather; Diabetes in his paternal grandfather; Healthy in his father and mother; Heart attack in his maternal grandfather.      No Known Allergies   Current Outpatient Medications:  .  famotidine (PEPCID) 20 MG tablet, Take 1 tablet (20 mg total) by mouth 2 (two) times daily., Disp: 60 tablet, Rfl: 1 .  ondansetron (ZOFRAN-ODT) 4 MG disintegrating tablet, Take 4 mg by mouth every 8 (eight) hours as needed for nausea., Disp: , Rfl: 0 .  PROMETHEGAN 25 MG suppository, Place 25 mg rectally every 6 (six) hours as needed for nausea., Disp: , Rfl: 0   Patient Care Team: Mar Daring, PA-C as PCP - General (Family Medicine)    Objective:    Vitals: BP 126/70 (BP Location: Left Arm, Patient Position: Sitting, Cuff Size: Normal)   Pulse 66   Temp (!) 97.3 F (36.3 C) (Temporal)   Ht 5\' 8"  (1.727 m)   Wt 163 lb (73.9 kg)   BMI 24.78 kg/m    Vitals:   08/15/19 0825  BP: 126/70  Pulse: 66  Temp: (!) 97.3 F (36.3 C)  TempSrc: Temporal  Weight: 163 lb (73.9 kg)  Height: 5\' 8"  (1.727 m)     Physical Exam Vitals reviewed.  Constitutional:      General: He is not in acute distress.    Appearance: Normal appearance. He is well-developed and normal weight. He is not ill-appearing.  HENT:     Head: Normocephalic and atraumatic.     Right Ear: Tympanic membrane, ear canal and external ear normal.     Left Ear: Tympanic membrane, ear canal and external ear normal.     Nose: Nose normal.     Mouth/Throat:     Mouth: Mucous membranes are moist.     Pharynx: Oropharynx is  clear. No oropharyngeal exudate or posterior oropharyngeal erythema.  Eyes:     General: No scleral icterus.       Right eye: No discharge.        Left eye: No discharge.     Extraocular Movements: Extraocular movements intact.     Conjunctiva/sclera: Conjunctivae normal.     Pupils: Pupils are equal, round, and reactive to light.  Neck:     Thyroid: No thyromegaly.     Trachea: No tracheal deviation.  Cardiovascular:     Rate and Rhythm: Normal rate and regular rhythm.     Pulses: Normal pulses.  Heart sounds: Normal heart sounds. No murmur.  Pulmonary:     Effort: Pulmonary effort is normal. No respiratory distress.     Breath sounds: Normal breath sounds. No wheezing or rales.  Chest:     Chest wall: No tenderness.    Abdominal:     General: Abdomen is flat. There is no distension.     Palpations: Abdomen is soft. There is no mass.     Tenderness: There is no abdominal tenderness. There is no guarding or rebound.  Genitourinary:    Pubic Area: No rash.      Penis: Circumcised. Lesions present. No erythema, tenderness, discharge or swelling.      Testes: Normal.    Musculoskeletal:        General: No tenderness. Normal range of motion.     Cervical back: Normal range of motion and neck supple.     Right lower leg: No edema.     Left lower leg: No edema.  Lymphadenopathy:     Cervical: No cervical adenopathy.  Skin:    General: Skin is warm and dry.     Capillary Refill: Capillary refill takes less than 2 seconds.     Findings: No erythema or rash.  Neurological:     General: No focal deficit present.     Mental Status: He is alert and oriented to person, place, and time. Mental status is at baseline.     Cranial Nerves: No cranial nerve deficit.     Motor: No abnormal muscle tone.     Coordination: Coordination normal.     Deep Tendon Reflexes: Reflexes are normal and symmetric. Reflexes normal.  Psychiatric:        Mood and Affect: Mood normal.         Behavior: Behavior normal.        Thought Content: Thought content normal.        Judgment: Judgment normal.      Depression Screen PHQ 2/9 Scores 08/15/2019  PHQ - 2 Score 0  PHQ- 9 Score 0       Assessment & Plan:     Routine Health Maintenance and Physical Exam  Exercise Activities and Dietary recommendations Goals   None     Immunization History  Administered Date(s) Administered  . Tdap 05/16/2017    Health Maintenance  Topic Date Due  . HIV Screening  11/21/1997  . INFLUENZA VACCINE  01/12/2019  . TETANUS/TDAP  05/17/2027     Discussed health benefits of physical activity, and encouraged him to engage in regular exercise appropriate for his age and condition.    1. Breast lump on left side at 1 o'clock position Mammogram and [place] ordered to confirm soft tissue cyst vs worrisome mass. Luckily it is moveable, well circumscribed, occasionally tender and is not associated with skin changes or nipple discharge. I will f/u pending these results.  - MS DIGITAL DIAG TOMO BILAT; Future - US BREAST LTD UNI LEFT INC AXILLA; Future - CBC w/Diff/Platelet - Comprehensive Metabolic Panel (CMET) - TSH - Lipid Panel With LDL/HDL Ratio - HgB A1c  2. Rectal bleeding Ongoing for 3 years. Will refer to [facility] for further evaluation. Consult appreciated. Will check labs as below and f/u pending results. - Ambulatory referral to Gastroenterology - CBC w/Diff/Platelet - Comprehensive Metabolic Panel (CMET) - TSH - Lipid Panel With LDL/HDL Ratio - HgB A1c  3. Verruca Improving. Will give new Rx for Condylox as below. Will check labs as below and f/u  pending results. - podofilox (CONDYLOX) 0.5 % gel; Apply topically 2 (two) times daily.  Dispense: 3.5 g; Refill: 0 - CBC w/Diff/Platelet - Comprehensive Metabolic Panel (CMET) - TSH - Lipid Panel With LDL/HDL Ratio - HgB A1c  4. Tongue abnormality Unsure of cause. Seemingly asymptomatic and not with worrisome  appearances. Will check labs as below and f/u pending results. - CBC w/Diff/Platelet - Comprehensive Metabolic Panel (CMET) - TSH - Lipid Panel With LDL/HDL Ratio - HgB A1c  5. Diaphoresis Nighttime. Will check labs as below and f/u pending results. - CBC w/Diff/Platelet - Comprehensive Metabolic Panel (CMET) - TSH - Lipid Panel With LDL/HDL Ratio - HgB A1c  --------------------------------------------------------------------    Margaretann Loveless, PA-C  [facility]

## 2019-08-16 ENCOUNTER — Telehealth: Payer: Self-pay

## 2019-08-16 LAB — CBC WITH DIFFERENTIAL/PLATELET
Basophils Absolute: 0 10*3/uL (ref 0.0–0.2)
Basos: 1 %
EOS (ABSOLUTE): 0 10*3/uL (ref 0.0–0.4)
Eos: 0 %
Hematocrit: 45.1 % (ref 37.5–51.0)
Hemoglobin: 15.4 g/dL (ref 13.0–17.7)
Immature Grans (Abs): 0 10*3/uL (ref 0.0–0.1)
Immature Granulocytes: 0 %
Lymphocytes Absolute: 1.9 10*3/uL (ref 0.7–3.1)
Lymphs: 26 %
MCH: 33.8 pg — ABNORMAL HIGH (ref 26.6–33.0)
MCHC: 34.1 g/dL (ref 31.5–35.7)
MCV: 99 fL — ABNORMAL HIGH (ref 79–97)
Monocytes Absolute: 0.8 10*3/uL (ref 0.1–0.9)
Monocytes: 11 %
Neutrophils Absolute: 4.6 10*3/uL (ref 1.4–7.0)
Neutrophils: 62 %
Platelets: 276 10*3/uL (ref 150–450)
RBC: 4.55 x10E6/uL (ref 4.14–5.80)
RDW: 11.8 % (ref 11.6–15.4)
WBC: 7.4 10*3/uL (ref 3.4–10.8)

## 2019-08-16 LAB — COMPREHENSIVE METABOLIC PANEL
ALT: 19 IU/L (ref 0–44)
AST: 26 IU/L (ref 0–40)
Albumin/Globulin Ratio: 2.3 — ABNORMAL HIGH (ref 1.2–2.2)
Albumin: 4.8 g/dL (ref 4.0–5.0)
Alkaline Phosphatase: 59 IU/L (ref 39–117)
BUN/Creatinine Ratio: 11 (ref 9–20)
BUN: 13 mg/dL (ref 6–20)
Bilirubin Total: 0.4 mg/dL (ref 0.0–1.2)
CO2: 22 mmol/L (ref 20–29)
Calcium: 9.8 mg/dL (ref 8.7–10.2)
Chloride: 102 mmol/L (ref 96–106)
Creatinine, Ser: 1.14 mg/dL (ref 0.76–1.27)
GFR calc Af Amer: 95 mL/min/{1.73_m2} (ref >59)
GFR calc non Af Amer: 82 mL/min/{1.73_m2} (ref >59)
Globulin, Total: 2.1 g/dL (ref 1.5–4.5)
Glucose: 103 mg/dL — ABNORMAL HIGH (ref 65–99)
Potassium: 4.6 mmol/L (ref 3.5–5.2)
Sodium: 140 mmol/L (ref 134–144)
Total Protein: 6.9 g/dL (ref 6.0–8.5)

## 2019-08-16 LAB — LIPID PANEL WITH LDL/HDL RATIO
Cholesterol, Total: 232 mg/dL — ABNORMAL HIGH (ref 100–199)
HDL: 57 mg/dL (ref >39)
LDL Chol Calc (NIH): 164 mg/dL — ABNORMAL HIGH (ref 0–99)
LDL/HDL Ratio: 2.9 ratio (ref 0.0–3.6)
Triglycerides: 66 mg/dL (ref 0–149)
VLDL Cholesterol Cal: 11 mg/dL (ref 5–40)

## 2019-08-16 LAB — HEMOGLOBIN A1C
Est. average glucose Bld gHb Est-mCnc: 108 mg/dL
Hgb A1c MFr Bld: 5.4 % (ref 4.8–5.6)

## 2019-08-16 LAB — TSH: TSH: 0.832 u[IU]/mL (ref 0.450–4.500)

## 2019-08-16 NOTE — Telephone Encounter (Signed)
-----   Message from Margaretann Loveless, PA-C sent at 08/16/2019  7:33 AM EST ----- Blood count is normal. Kidney function and liver enzymes are normal. Sodium, potassium and calcium are normal. A1c/Sugar is normal. Thyroid is normal. Cholesterol is borderline high at 232 but LDL (bad) cholesterol is elevated at 164. We like LDL to be closer to 100 or less. Currently with these numbers and with still smoking you do have a 50% lifetime risk of having a cardiovascular event. Working on those healthy lifestyle modifications including: quitting smoking as you are trying, limiting fatty foods (specifically saturated fats), red meats, processed meats and sugars can help.

## 2019-08-16 NOTE — Telephone Encounter (Signed)
[employer]   Result Notes and Comments to Patient Comment seen by patient Stephen Ochoa on 08/16/2019 8:28 AM EST

## 2019-08-23 ENCOUNTER — Other Ambulatory Visit: Payer: 59

## 2019-08-28 ENCOUNTER — Ambulatory Visit
Admission: RE | Admit: 2019-08-28 | Discharge: 2019-08-28 | Disposition: A | Discharge status: Home / Self Care | Payer: Managed Care, Other (non HMO) | Source: Ambulatory Visit | Attending: Physician Assistant | Admitting: Physician Assistant

## 2019-08-28 ENCOUNTER — Other Ambulatory Visit: Payer: Self-pay | Admitting: Physician Assistant

## 2019-08-28 DIAGNOSIS — N6321 Unspecified lump in the left breast, upper outer quadrant: Secondary | ICD-10-CM

## 2019-08-28 DIAGNOSIS — R928 Other abnormal and inconclusive findings on diagnostic imaging of breast: Secondary | ICD-10-CM

## 2019-08-28 DIAGNOSIS — N6320 Unspecified lump in the left breast, unspecified quadrant: Secondary | ICD-10-CM

## 2019-09-04 ENCOUNTER — Ambulatory Visit
Admission: RE | Admit: 2019-09-04 | Discharge: 2019-09-04 | Disposition: A | Discharge status: Home / Self Care | Payer: Managed Care, Other (non HMO) | Source: Ambulatory Visit | Attending: Physician Assistant | Admitting: Physician Assistant

## 2019-09-04 DIAGNOSIS — R928 Other abnormal and inconclusive findings on diagnostic imaging of breast: Secondary | ICD-10-CM

## 2019-09-04 DIAGNOSIS — N6320 Unspecified lump in the left breast, unspecified quadrant: Secondary | ICD-10-CM

## 2019-09-04 HISTORY — PX: BREAST BIOPSY: SHX20

## 2019-09-05 LAB — SURGICAL PATHOLOGY

## 2019-09-25 ENCOUNTER — Encounter: Payer: Self-pay | Admitting: Physician Assistant

## 2019-09-25 ENCOUNTER — Other Ambulatory Visit: Payer: Self-pay

## 2019-09-25 ENCOUNTER — Ambulatory Visit (INDEPENDENT_AMBULATORY_CARE_PROVIDER_SITE_OTHER): Payer: Managed Care, Other (non HMO) | Admitting: Gastroenterology

## 2019-09-25 ENCOUNTER — Encounter: Payer: Self-pay | Admitting: Gastroenterology

## 2019-09-25 VITALS — BP 104/63 | HR 67 | Temp 97.6°F | Ht 68.0 in | Wt 164.2 lb

## 2019-09-25 DIAGNOSIS — K921 Melena: Secondary | ICD-10-CM

## 2019-09-25 NOTE — Progress Notes (Signed)
Gastroenterology Consultation  Referring Provider:     Florian Buff* Primary Care Physician:  Mar Daring, PA-C Primary Gastroenterologist:  Dr. Allen Norris     Reason for Consultation:     Rectal bleeding        HPI:   Stephen Ochoa is a 39 y.o. y/o male referred for consultation & management of rectal bleeding by Dr. Marlyn Corporal, Clearnce Sorrel, PA-C.  This patient comes in today after seeing his primary care provider in March of this year with a report of rectal bleeding.  The patient had reported at that time that the rectal bleeding has been going on for 3 years.  The patient had blood work with his hemoglobin being over 15 and his liver enzymes being normal.  The patient reports that approximate 2 years ago he had a week long of nausea and vomiting but has not had any recurrence of that.  He was taking suppositories for nausea at that time and it finally resolved on its own.  He is concerned because his rectal bleeding is not always bright red blood but sometimes clots mixed with the stool.  He denies any family history of colon cancer or colon polyps.  There is no report of any abdominal pain.  The patient has gained a little weight and he states that he has not been eating like he used to do when he is not working out anymore.  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  History reviewed. No pertinent past medical history.  Past Surgical History:  Procedure Laterality Date  . BREAST BIOPSY Left 09/04/2019   u/s core bx path pedning  . NO PAST SURGERIES      Prior to Admission medications   Medication Sig Start Date End Date Taking? Authorizing Provider  podofilox (CONDYLOX) 0.5 % gel Apply topically 2 (two) times daily. 08/15/19   Mar Daring, PA-C    Family History  Problem Relation Age of Onset  . Healthy Mother   . Healthy Father   . Heart attack Maternal Grandfather   . Cancer  Paternal Grandfather        lymphoma  . Diabetes Paternal Grandfather      Social History   Tobacco Use  . Smoking status: Current Every Day Smoker    Packs/day: 0.25    Years: 15.00    Pack years: 3.75  . Smokeless tobacco: Never Used  Substance Use Topics  . Alcohol use: Yes    Comment: social  . Drug use: Yes    Types: Marijuana    Comment: occasional    Allergies as of 09/25/2019  . (No Known Allergies)    Review of Systems:    All systems reviewed and negative except where noted in HPI.   Physical Exam:  BP 104/63   Pulse 67   Temp 97.6 F (36.4 C) (Oral)   Ht '5\' 8"'  (1.727 m)   Wt 164 lb 3.2 oz (74.5 kg)   BMI 24.97 kg/m  No LMP for male patient. General:   Alert,  Well-developed, well-nourished, pleasant and cooperative in NAD Head:  Normocephalic and atraumatic. Eyes:  Sclera clear, no icterus.   Conjunctiva pink. Ears:  Normal auditory acuity. Neck:  Supple; no masses or thyromegaly. Lungs:  Respirations even and unlabored.  Clear throughout to auscultation.   No wheezes, crackles, or rhonchi. No acute distress. Heart:  Regular  rate and rhythm; no murmurs, clicks, rubs, or gallops. Abdomen:  Normal bowel sounds.  No bruits.  Soft, non-tender and non-distended without masses, hepatosplenomegaly or hernias noted.  No guarding or rebound tenderness.  Negative Carnett sign.   Rectal:  Deferred.  Pulses:  Normal pulses noted. Extremities:  No clubbing or edema.  No cyanosis. Neurologic:  Alert and oriented x3;  grossly normal neurologically. Skin:  Intact without significant lesions or rashes.  No jaundice. Lymph Nodes:  No significant cervical adenopathy. Psych:  Alert and cooperative. Normal mood and affect.  Imaging Studies: US BREAST LTD UNI LEFT INC AXILLA  Result Date: 08/28/2019 CLINICAL DATA:  39 year old male presenting with a left breast lump and tenderness. EXAM: DIGITAL DIAGNOSTIC BILATERAL MAMMOGRAM WITH CAD AND TOMO ULTRASOUND LEFT BREAST  COMPARISON:  Previous exam(s). ACR Breast Density Category b: There are scattered areas of fibroglandular density. FINDINGS: Mammogram: Right breast: No suspicious mass, distortion, or microcalcifications are identified to suggest presence of malignancy. Left breast: At the palpable site of concern in the retroareolar left breast there is an oval circumscribed mass measuring approximately 2.6 cm. There are no additional findings in the left breast. Mammographic images were processed with CAD. On physical exam, I palpate a discrete mass in the retroareolar left breast. There are no overlying skin changes. Ultrasound: Targeted ultrasound is performed in the left breast at 3 o'clock 1 cm from the nipple at the palpable site of concern demonstrating an oval circumscribed hypoechoic mass measuring 2.7 x 0.6 x 2.6 cm. There is some internal vascularity. This corresponds to the mammographic finding. Targeted ultrasound of the left axilla demonstrates two abnormal lymph nodes with thickened cortices measuring up to 0.7 cm. The patient reports he had the COVID vaccination in his left arm approximately 1-2 weeks ago. IMPRESSION: 1. Left breast mass at the palpable site of concern measuring 2.7 cm is indeterminate, possibly representing nodular gynecomastia. 2. Enlarged left axillary lymph nodes with thickened cortices. These may be reactive given recent COVID vaccination. RECOMMENDATION: 1. Ultrasound-guided core needle biopsy of the left breast mass at 3 o'clock/retroareolar. 2. If the breast biopsy returns with benign pathology, recommend 3 month follow-up ultrasound of the left axillary lymph nodes. If the breast biopsy returns as atypia or malignancy, recommend ultrasound-guided core needle biopsy of a left axillary lymph node. I have discussed the findings and recommendations with the patient. If applicable, a reminder letter will be sent to the patient regarding the next appointment. BI-RADS CATEGORY  4: Suspicious.  Electronically Signed   By: Audie Pinto M.D.   On: 08/28/2019 10:26   MM DIAG BREAST TOMO BILATERAL  Result Date: 08/28/2019 CLINICAL DATA:  39 year old male presenting with a left breast lump and tenderness. EXAM: DIGITAL DIAGNOSTIC BILATERAL MAMMOGRAM WITH CAD AND TOMO ULTRASOUND LEFT BREAST COMPARISON:  Previous exam(s). ACR Breast Density Category b: There are scattered areas of fibroglandular density. FINDINGS: Mammogram: Right breast: No suspicious mass, distortion, or microcalcifications are identified to suggest presence of malignancy. Left breast: At the palpable site of concern in the retroareolar left breast there is an oval circumscribed mass measuring approximately 2.6 cm. There are no additional findings in the left breast. Mammographic images were processed with CAD. On physical exam, I palpate a discrete mass in the retroareolar left breast. There are no overlying skin changes. Ultrasound: Targeted ultrasound is performed in the left breast at 3 o'clock 1 cm from the nipple at the palpable site of concern demonstrating an oval circumscribed hypoechoic mass measuring 2.7  x 0.6 x 2.6 cm. There is some internal vascularity. This corresponds to the mammographic finding. Targeted ultrasound of the left axilla demonstrates two abnormal lymph nodes with thickened cortices measuring up to 0.7 cm. The patient reports he had the COVID vaccination in his left arm approximately 1-2 weeks ago. IMPRESSION: 1. Left breast mass at the palpable site of concern measuring 2.7 cm is indeterminate, possibly representing nodular gynecomastia. 2. Enlarged left axillary lymph nodes with thickened cortices. These may be reactive given recent COVID vaccination. RECOMMENDATION: 1. Ultrasound-guided core needle biopsy of the left breast mass at 3 o'clock/retroareolar. 2. If the breast biopsy returns with benign pathology, recommend 3 month follow-up ultrasound of the left axillary lymph nodes. If the breast biopsy  returns as atypia or malignancy, recommend ultrasound-guided core needle biopsy of a left axillary lymph node. I have discussed the findings and recommendations with the patient. If applicable, a reminder letter will be sent to the patient regarding the next appointment. BI-RADS CATEGORY  4: Suspicious. Electronically Signed   By: Audie Pinto M.D.   On: 08/28/2019 10:26   MM CLIP PLACEMENT LEFT  Result Date: 09/04/2019 CLINICAL DATA:  Patient status post ultrasound-guided biopsy left breast mass. EXAM: DIAGNOSTIC LEFT MAMMOGRAM POST ULTRASOUND BIOPSY COMPARISON:  Previous exam(s). FINDINGS: Mammographic images were obtained following ultrasound guided biopsy of left breast mass. The biopsy marking clip is in expected position at the site of biopsy. IMPRESSION: Appropriate positioning of the venous shaped biopsy marking clip at the site of biopsy in the left breast mass. Final Assessment: Post Procedure Mammograms for Marker Placement Electronically Signed   By: Lovey Newcomer M.D.   On: 09/04/2019 13:39   [place] LT BREAST BX W LOC DEV 1ST LESION IMG BX SPEC US GUIDE  Addendum Date: 09/05/2019   ADDENDUM REPORT: 09/05/2019 15:53 ADDENDUM: PATHOLOGY revealed: A. LEFT BREAST, RETROAREOLAR 3:00; ULTRASOUND-GUIDED BIOPSY: - BENIGN BREAST TISSUE WITH GYNECOMASTOID HYPERPLASIA. - NEGATIVE FOR ATYPIA AND MALIGNANCY. Pathology results are CONCORDANT with imaging findings, per Dr. Lovey Newcomer. Pathology results and recommendations below were discussed with patient by telephone on 09/05/2019. Patient reported biopsy site doing well with slight tenderness at the site. Post biopsy care instructions were reviewed and questions were answered. Patient was instructed to call [facility] if any concerns or questions arise related to the biopsy. Recommendation: Patient instructed to return in three months for unilateral LEFT axillary ultrasound to reevaluate lymph nodes. Patient informed a reminder notice will be sent  regarding this appointment. Addendum by Electa Sniff RN on 09/05/2019. Electronically Signed   By: Lovey Newcomer M.D.   On: 09/05/2019 15:53   Result Date: 09/05/2019 CLINICAL DATA:  Patient with indeterminate retroareolar left breast mass. EXAM: ULTRASOUND GUIDED LEFT BREAST CORE NEEDLE BIOPSY COMPARISON:  Previous exam(s). PROCEDURE: I met with the patient and we discussed the procedure of ultrasound-guided biopsy, including benefits and alternatives. We discussed the high likelihood of a successful procedure. We discussed the risks of the procedure, including infection, bleeding, tissue injury, clip migration, and inadequate sampling. Informed written consent was given. The usual time-out protocol was performed immediately prior to the procedure. Lesion quadrant: Upper outer quadrant Using sterile technique and 1% Lidocaine as local anesthetic, under direct ultrasound visualization, a 14 gauge spring-loaded device was used to perform biopsy of retroareolar mass left breast using a lateral approach. At the conclusion of the procedure venous shaped tissue marker clip was deployed into the biopsy cavity. Follow up 2 view mammogram was performed and dictated  separately. IMPRESSION: Ultrasound guided biopsy of left breast retroareolar mass. No apparent complications. Electronically Signed: By: Lovey Newcomer M.D. On: 09/04/2019 13:44    Assessment and Plan:   AAMARI STRAWDERMAN is a 39 y.o. y/o male who comes in today with a history of rectal bleeding that has been going on for 3 years.  The rectal bleeding is sometimes bright red blood per rectum and other times passage of clots mixed with the stools.  The patient has no unexplained weight loss fevers chills nausea vomiting abdominal pain or family history of colon cancer or colon polyps.  The patient will be set up for a colonoscopy to evaluate the possible cause of his rectal bleeding.  The patient has been told that his most likely hemorrhoidal bleeding and if the  colonoscopy shows that he can be treated either with banding or medically. I have discussed risks & benefits which include, but are not limited to, bleeding, infection, perforation & drug reaction.  The patient agrees with this plan & written consent will be obtained.       Lucilla Lame, MD. Marval Regal    Note: This dictation was prepared with Dragon dictation along with smaller phrase technology. Any transcriptional errors that result from this process are unintentional.

## 2019-10-02 ENCOUNTER — Ambulatory Visit: Payer: 59 | Admitting: Gastroenterology
